# Patient Record
Sex: Male | Born: 1935 | Race: White | Hispanic: No | Smoking: Former smoker
Health system: Southern US, Community
[De-identification: ages and names within clinical notes are randomized; demographics above are authoritative.]

## PROBLEM LIST (undated history)

## (undated) DIAGNOSIS — F0390 Unspecified dementia without behavioral disturbance: Secondary | ICD-10-CM

## (undated) DIAGNOSIS — I499 Cardiac arrhythmia, unspecified: Secondary | ICD-10-CM

## (undated) DIAGNOSIS — R296 Repeated falls: Secondary | ICD-10-CM

## (undated) DIAGNOSIS — K219 Gastro-esophageal reflux disease without esophagitis: Secondary | ICD-10-CM

## (undated) DIAGNOSIS — I4891 Unspecified atrial fibrillation: Secondary | ICD-10-CM

## (undated) HISTORY — PX: CATARACT EXTRACTION: SUR2

## (undated) HISTORY — PX: OTHER SURGICAL HISTORY: SHX169

## (undated) HISTORY — PX: HERNIA REPAIR: SHX51

---

## 2011-03-04 ENCOUNTER — Emergency Department (HOSPITAL_BASED_OUTPATIENT_CLINIC_OR_DEPARTMENT_OTHER)
Admission: EM | Admit: 2011-03-04 | Discharge: 2011-03-04 | Disposition: A | Discharge status: Home / Self Care | Payer: Medicare Other | Attending: Emergency Medicine | Admitting: Emergency Medicine

## 2011-03-04 ENCOUNTER — Encounter (HOSPITAL_BASED_OUTPATIENT_CLINIC_OR_DEPARTMENT_OTHER): Payer: Self-pay

## 2011-03-04 ENCOUNTER — Emergency Department (INDEPENDENT_AMBULATORY_CARE_PROVIDER_SITE_OTHER): Payer: Medicare Other

## 2011-03-04 DIAGNOSIS — M792 Neuralgia and neuritis, unspecified: Secondary | ICD-10-CM

## 2011-03-04 DIAGNOSIS — IMO0002 Reserved for concepts with insufficient information to code with codable children: Secondary | ICD-10-CM | POA: Insufficient documentation

## 2011-03-04 DIAGNOSIS — R51 Headache: Secondary | ICD-10-CM | POA: Insufficient documentation

## 2011-03-04 DIAGNOSIS — Z79899 Other long term (current) drug therapy: Secondary | ICD-10-CM | POA: Insufficient documentation

## 2011-03-04 DIAGNOSIS — G319 Degenerative disease of nervous system, unspecified: Secondary | ICD-10-CM

## 2011-03-04 DIAGNOSIS — H9209 Otalgia, unspecified ear: Secondary | ICD-10-CM

## 2011-03-04 DIAGNOSIS — K219 Gastro-esophageal reflux disease without esophagitis: Secondary | ICD-10-CM | POA: Insufficient documentation

## 2011-03-04 HISTORY — DX: Cardiac arrhythmia, unspecified: I49.9

## 2011-03-04 HISTORY — DX: Gastro-esophageal reflux disease without esophagitis: K21.9

## 2011-03-04 LAB — CBC
HCT: 36.1 % — ABNORMAL LOW (ref 39.0–52.0)
Platelets: 178 10*3/uL (ref 150–400)
RBC: 3.94 MIL/uL — ABNORMAL LOW (ref 4.22–5.81)
RDW: 13.9 % (ref 11.5–15.5)
WBC: 5.3 10*3/uL (ref 4.0–10.5)

## 2011-03-04 LAB — DIFFERENTIAL
Basophils Absolute: 0 10*3/uL (ref 0.0–0.1)
Lymphocytes Relative: 13 % (ref 12–46)
Neutro Abs: 4.2 10*3/uL (ref 1.7–7.7)

## 2011-03-04 LAB — BASIC METABOLIC PANEL
CO2: 26 mEq/L (ref 19–32)
Chloride: 102 mEq/L (ref 96–112)
Sodium: 138 mEq/L (ref 135–145)

## 2011-03-04 MED ORDER — PREGABALIN 25 MG PO CAPS: 25.0000 mg | ORAL_CAPSULE | Freq: Three times a day (TID) | ORAL | Status: AC

## 2011-03-04 NOTE — ED Provider Notes (Signed)
Medical screening examination/treatment/procedure(s) were conducted as a shared visit with non-physician practitioner(s) and myself.  I personally evaluated the patient during the encounter.  Please see complaeted note for this encounter.  Raeford Razor, MD 03/04/11 1348

## 2011-03-04 NOTE — ED Provider Notes (Signed)
History     CSN: [id number]  Arrival date & time 03/04/11  1154   First MD Initiated Contact with Patient 03/04/11 1232     12:33 PM HPI Patient reports this morning at at 8:00 AM developed a sharp pain in his right for head. Since then pain has resolved but migrated to his right postauricular region at 10 AM. States pain is intermittent and fleeting. Reports pain only lasts a few seconds and is severe. Currently pain is 0/10. Denies history of headaches, strokes, or recent falls. Patient is a 78 y.o. male presenting with headaches. The history is provided by the patient.  Headache  This is a new problem. The current episode started 3 to 5 hours ago. The problem occurs every few minutes. The headache is associated with nothing. Pain location: Right frontal and right postauricular. The quality of the pain is described as sharp. The pain is severe. The pain does not radiate. Pertinent negatives include no fever, no malaise/fatigue, no chest pressure, no near-syncope, no palpitations, no syncope, no shortness of breath, no nausea and no vomiting. Associated symptoms comments: Denies neck pain, decreased hearing, numbness, tingling, weakness, change in vision, rhinorrhea, sore throat, fever, or rash.. He has tried nothing for the symptoms.    Past Medical History  Diagnosis Date  . GERD (gastroesophageal reflux disease)   . Irregular heart beat     Past Surgical History  Procedure Date  . Pacemaker   . Hernia repair   . Cataract extraction     No family history on file.  History  Substance Use Topics  . Smoking status: Former Games developer  . Smokeless tobacco: Not on file  . Alcohol Use: No      Review of Systems  Constitutional: Negative for fever, chills and malaise/fatigue.  HENT: Negative for hearing loss, ear pain, congestion, sore throat, rhinorrhea, trouble swallowing, neck pain, neck stiffness, sinus pressure, tinnitus and ear discharge.   Eyes: Negative for pain and visual  disturbance.  Respiratory: Negative for cough and shortness of breath.   Cardiovascular: Negative for chest pain, palpitations, syncope and near-syncope.  Gastrointestinal: Negative for nausea and vomiting.  Skin: Negative for rash.  Neurological: Positive for headaches. Negative for dizziness, speech difficulty, weakness, light-headedness and numbness.  All other systems reviewed and are negative.    Allergies  Review of patient's allergies indicates no known allergies.  Home Medications   Current Outpatient Rx  Name Route Sig Dispense Refill  . ASPIRIN 325 MG PO TABS Oral Take 325 mg by mouth daily.    . ATENOLOL 25 MG PO TABS Oral Take 12.5 mg by mouth daily.    . ENALAPRIL MALEATE 5 MG PO TABS Oral Take 5 mg by mouth daily.    . FENOFIBRATE 54 MG PO TABS Oral Take 54 mg by mouth daily.    Marland Kitchen FINASTERIDE 5 MG PO TABS Oral Take 5 mg by mouth daily.    Marland Kitchen GLUCOSAMINE-CHONDROITIN 500-400 MG PO TABS Oral Take 1 tablet by mouth 3 (three) times daily.    Marland Kitchen OMEPRAZOLE 20 MG PO CPDR Oral Take 20 mg by mouth daily.    Marland Kitchen POTASSIUM CHLORIDE PO Oral Take 10 mEq by mouth.      BP 143/78  Pulse 70  Temp(Src) 97.4 F (36.3 C) (Oral)  Resp 20  Ht 6' (1.829 m)  Wt 175 lb (79.379 kg)  BMI 23.73 kg/m2  SpO2 97%  Physical Exam  Constitutional: He is oriented to person, place, and time.  He appears well-developed and well-nourished. No distress.  HENT:  Head: Normocephalic and atraumatic. Head is without abrasion and without contusion.  Right Ear: Tympanic membrane, external ear and ear canal normal.  Left Ear: Tympanic membrane, external ear and ear canal normal.  Nose: Nose normal.  Mouth/Throat: Uvula is midline, oropharynx is clear and moist and mucous membranes are normal.  Eyes: Conjunctivae, EOM and lids are normal. Pupils are equal, round, and reactive to light.  Neck: Normal range of motion. Neck supple. No spinous process tenderness and no muscular tenderness present.    Cardiovascular: Normal rate, regular rhythm and normal heart sounds.  Exam reveals no gallop and no friction rub.   No murmur heard. Pulmonary/Chest: Effort normal and breath sounds normal. He has no wheezes. He has no rales. He exhibits no tenderness.  Abdominal: Soft. Bowel sounds are normal.  Lymphadenopathy:    He has no cervical adenopathy.  Neurological: He is alert and oriented to person, place, and time. He has normal strength. No cranial nerve deficit or sensory deficit. Coordination normal. GCS eye subscore is 4. GCS verbal subscore is 5. GCS motor subscore is 6.  Skin: Skin is warm and dry. No rash noted. No erythema. No pallor.  Psychiatric: He has a normal mood and affect. His behavior is normal.    ED Course  Procedures  Results for orders placed during the hospital encounter of 03/04/11  CBC      Component Value Range   WBC 5.3  4.0 - 10.5 (K/uL)   RBC 3.94 (*) 4.22 - 5.81 (MIL/uL)   Hemoglobin 12.2 (*) 13.0 - 17.0 (g/dL)   HCT 40.9 (*) 81.1 - 52.0 (%)   MCV 91.6  78.0 - 100.0 (fL)   MCH 31.0  26.0 - 34.0 (pg)   MCHC 33.8  30.0 - 36.0 (g/dL)   RDW 91.4  78.2 - 95.6 (%)   Platelets 178  150 - 400 (K/uL)  DIFFERENTIAL      Component Value Range   Neutrophils Relative 78 (*) 43 - 77 (%)   Neutro Abs 4.2  1.7 - 7.7 (K/uL)   Lymphocytes Relative 13  12 - 46 (%)   Lymphs Abs 0.7  0.7 - 4.0 (K/uL)   Monocytes Relative 7  3 - 12 (%)   Monocytes Absolute 0.4  0.1 - 1.0 (K/uL)   Eosinophils Relative 1  0 - 5 (%)   Eosinophils Absolute 0.1  0.0 - 0.7 (K/uL)   Basophils Relative 0  0 - 1 (%)   Basophils Absolute 0.0  0.0 - 0.1 (K/uL)  BASIC METABOLIC PANEL      Component Value Range   Sodium 138  135 - 145 (mEq/L)   Potassium 4.8  3.5 - 5.1 (mEq/L)   Chloride 102  96 - 112 (mEq/L)   CO2 26  19 - 32 (mEq/L)   Glucose, Bld 105 (*) 70 - 99 (mg/dL)   BUN 28 (*) 6 - 23 (mg/dL)   Creatinine, Ser 2.13  0.50 - 1.35 (mg/dL)   Calcium 9.1  8.4 - 08.6 (mg/dL)   GFR calc non  Af Amer 63 (*) >90 (mL/min)   GFR calc Af Amer 73 (*) >90 (mL/min)   Ct Head Wo Contrast  03/04/2011  *RADIOLOGY REPORT*  Clinical Data: Intermittent sharp pain behind the right ear since yesterday.  CT HEAD WITHOUT CONTRAST  Technique:  Contiguous axial images were obtained from the base of the skull through the vertex without contrast.  Comparison:  None.  Findings: There is no evidence for acute infarction, intracranial hemorrhage, mass lesion, hydrocephalus, or extra-axial fluid. There is moderate age related atrophy with chronic microvascular ischemic change.  Calvarium is intact.  Bilateral cataract extraction has been performed.  There is advanced vascular calcification involving the vertebral arteries, carotid artery, and basilar artery.  There are no CT signs of proximal vascular thrombosis.  IMPRESSION: Atrophy and small vessel disease.  No acute intracranial findings.  Original Report Authenticated By: Elsie Stain, M.D.     MDM   12:39 PM Patient points to pain in a specific spot in the right postauricular area. Will order CT scan basic labs to evaluate for stroke.  1:36 PM Discussed results with patient and family. Likely patient is having neuropathic pain. Will discharge patient with one week of Lyrica. Advised patient to followup with primary care physician if symptoms persist or to obtain a refill. Advised patient to return to ED for worsening symptoms such as numbness, tingling, weakness, change in vision, difficulty with speech. Patient and family agree on plan and for discharge.  Thomasene Lot, PA-C 03/04/11 1340

## 2011-03-04 NOTE — ED Provider Notes (Signed)
Medical screening examination/treatment/procedure(s) were conducted as a shared visit with non-physician practitioner(s) and myself.  I personally evaluated the patient during the encounter.  78 year old male with headache. Patient describes painas very brief lasting 1-3 seconds and sharp in nature. Pain is in the right occipital region into his right ear. No neurological complaints and has a nonfocal neurological examination. Workup today including a CT of his head was unremarkable. Symptoms are consistent with neuralgia. patient requesting something for his pain. Discussed with patient and his son that this pain can often be hard to control but will try a low-dose Lyrica. Discussed that this may end up being a chronic issue and  further management or treatment is more appropriate for his primary care doctor.  Raeford Razor, MD 03/04/11 [phone]

## 2011-03-04 NOTE — ED Notes (Signed)
C/o HA right forehead started 8am-pain behind right ear started 10am-denies pain to forehead area at present-pain behind right ear is intermittent and severe

## 2011-11-14 ENCOUNTER — Emergency Department (HOSPITAL_BASED_OUTPATIENT_CLINIC_OR_DEPARTMENT_OTHER): Payer: Medicare Other

## 2011-11-14 ENCOUNTER — Encounter (HOSPITAL_BASED_OUTPATIENT_CLINIC_OR_DEPARTMENT_OTHER): Payer: Self-pay | Admitting: *Deleted

## 2011-11-14 ENCOUNTER — Emergency Department (HOSPITAL_BASED_OUTPATIENT_CLINIC_OR_DEPARTMENT_OTHER)
Admission: EM | Admit: 2011-11-14 | Discharge: 2011-11-14 | Disposition: A | Discharge status: Home / Self Care | Payer: Medicare Other | Attending: Emergency Medicine | Admitting: Emergency Medicine

## 2011-11-14 DIAGNOSIS — W1789XA Other fall from one level to another, initial encounter: Secondary | ICD-10-CM | POA: Insufficient documentation

## 2011-11-14 DIAGNOSIS — M25519 Pain in unspecified shoulder: Secondary | ICD-10-CM | POA: Insufficient documentation

## 2011-11-14 DIAGNOSIS — Z950 Presence of cardiac pacemaker: Secondary | ICD-10-CM | POA: Insufficient documentation

## 2011-11-14 DIAGNOSIS — Z7982 Long term (current) use of aspirin: Secondary | ICD-10-CM | POA: Insufficient documentation

## 2011-11-14 DIAGNOSIS — Z87891 Personal history of nicotine dependence: Secondary | ICD-10-CM | POA: Insufficient documentation

## 2011-11-14 DIAGNOSIS — K219 Gastro-esophageal reflux disease without esophagitis: Secondary | ICD-10-CM | POA: Insufficient documentation

## 2011-11-14 DIAGNOSIS — M25539 Pain in unspecified wrist: Secondary | ICD-10-CM | POA: Insufficient documentation

## 2011-11-14 DIAGNOSIS — W19XXXA Unspecified fall, initial encounter: Secondary | ICD-10-CM

## 2011-11-14 NOTE — ED Notes (Signed)
Pt c/o fall this am from standing c/o right shoulder and wrist pain

## 2011-11-14 NOTE — ED Provider Notes (Signed)
History   This chart was scribed for Leon Chick, MD by Leon Evans. The patient was seen in room MH05/MH05 and the patient's care was started at 3:27PM    CSN: [id number]  Arrival date & time 11/14/11  1440   First MD Initiated Contact with Patient 11/14/11 1527      Chief Complaint  Patient presents with  . Fall    (Consider location/radiation/quality/duration/timing/severity/associated sxs/prior treatment) Patient is a 78 y.o. male presenting with fall. The history is provided by the patient. No language interpreter was used.  Fall The accident occurred 3 to 5 hours ago. The fall occurred while standing. He fell from a height of 3 to 5 ft. He landed on carpet. There was no blood loss. The point of impact was the head, right wrist and right shoulder. The pain is present in the right wrist and right shoulder. The pain is moderate. He was ambulatory at the scene. There was no entrapment after the fall. There was no drug use involved in the accident. There was no alcohol use involved in the accident. Pertinent negatives include no visual change, no numbness, no nausea, no vomiting, no headaches, no loss of consciousness and no tingling. The symptoms are aggravated by pressure on the injury. He has tried nothing for the symptoms. The treatment provided no relief.    Leon Evans is a 78 y.o. male who lives at [facility], who presents to the Emergency Department complaining of sudden, progressively worsening, wrist pain located at the right wrist onset today, with associated symptoms of right shoulder pain. The pt reports he fell backwards this morning, tripping over his foot, and impacting upon his right shoulder, right wrist, and head. In addition, the pt informs that when he fell he tried to brace himself with his right wrist just before hitting the ground. Modifying factors include taking aleve which provides moderate relief of the right wrist pain and application of pressure on the right  wrist which intensifies the right wrist pain. The pt has a hx of GERD, irregular heart beat, pacemaker placement, hernia repair, and cataract extraction.   No LOC, no vomiting, no seizure activity.    The pt denies taking any blood thinners at present, LOC, and dizziness.    The pt does not smoke or drink alcohol.    Past Medical History  Diagnosis Date  . GERD (gastroesophageal reflux disease)   . Irregular heart beat     Past Surgical History  Procedure Date  . Pacemaker   . Hernia repair   . Cataract extraction     History reviewed. No pertinent family history.  History  Substance Use Topics  . Smoking status: Former Games developer  . Smokeless tobacco: Not on file  . Alcohol Use: No      Review of Systems  Gastrointestinal: Negative for nausea and vomiting.  Neurological: Negative for tingling, loss of consciousness, numbness and headaches.  All other systems reviewed and are negative.    Allergies  Review of patient's allergies indicates no known allergies.  Home Medications   Current Outpatient Rx  Name Route Sig Dispense Refill  . FUROSEMIDE 20 MG PO TABS Oral Take 20 mg by mouth 2 (two) times daily.    . ASPIRIN 325 MG PO TABS Oral Take 325 mg by mouth daily.    . ATENOLOL 25 MG PO TABS Oral Take 12.5 mg by mouth daily.    . ENALAPRIL MALEATE 5 MG PO TABS Oral Take 5 mg by  mouth daily.    . FENOFIBRATE 54 MG PO TABS Oral Take 54 mg by mouth daily.    Marland Kitchen FINASTERIDE 5 MG PO TABS Oral Take 5 mg by mouth daily.    Marland Kitchen GLUCOSAMINE-CHONDROITIN 500-400 MG PO TABS Oral Take 1 tablet by mouth 3 (three) times daily.    Marland Kitchen OMEPRAZOLE 20 MG PO CPDR Oral Take 20 mg by mouth daily.    Marland Kitchen POTASSIUM CHLORIDE PO Oral Take 10 mEq by mouth.    Marland Kitchen PREGABALIN 25 MG PO CAPS Oral Take 1 capsule (25 mg total) by mouth 3 (three) times daily. 21 capsule 0    BP 169/80  Pulse 72  Temp 97.8 F (36.6 C) (Oral)  Resp 16  Ht 6' (1.829 m)  Wt 166 lb (75.297 kg)  BMI 22.51 kg/m2  SpO2  100%  Physical Exam  Nursing note and vitals reviewed. Constitutional: He is oriented to person, place, and time. He appears well-developed and well-nourished.  HENT:  Head: Atraumatic.  Right Ear: External ear normal.  Left Ear: External ear normal.  Nose: Nose normal.  Eyes: Conjunctivae normal and EOM are normal.  Neck: Normal range of motion. Neck supple.  Cardiovascular: Normal rate, regular rhythm and normal heart sounds.   Pulmonary/Chest: Effort normal and breath sounds normal.  Abdominal: Soft.  Musculoskeletal: Normal range of motion.       Cervical back: He exhibits no tenderness.       Thoracic back: He exhibits no tenderness.       Lumbar back: He exhibits no tenderness.       3 cm bruising over the right scapula, no midline tenderness. Right shoulder: full ROM, no bony point tenderness. Right wrist: no tenderness to palpitation, full ROM, no deformity detected. No anatomic snuff box tenderness at right wrist. All extremities NVI.  Neurological: He is alert and oriented to person, place, and time.  Skin: Skin is warm and dry.  Psychiatric: He has a normal mood and affect. His behavior is normal.    ED Course  Procedures (including critical care time)  DIAGNOSTIC STUDIES: Oxygen Saturation is 100% on room air, normal by my interpretation.    COORDINATION OF CARE:    3:49Pm- X-rays of right wrist and right shoulder discussed with patient. Pt agrees with treatment.   Labs Reviewed - No data to display Dg Shoulder Right  11/14/2011  *RADIOLOGY REPORT*  Clinical Data: Fall, pain.  RIGHT SHOULDER - 2+ VIEW  Comparison:  None.  Findings:  There is no evidence of fracture or dislocation.  There is no evidence of arthropathy or other focal bone abnormality. Soft tissues are unremarkable. Mild degenerative change AC joint. Mild demineralization.  IMPRESSION: No acute abnormality.   Original Report Authenticated By: Elsie Stain, M.D.    Dg Wrist Complete  Right  11/14/2011  *RADIOLOGY REPORT*  Clinical Data: Fall, pain  RIGHT WRIST - COMPLETE 3+ VIEW  Comparison: None.  Findings: Mild degenerative change at the radiocarpal joint.  No fracture or dislocation.  No significant soft tissue swelling.  IMPRESSION: As above.   Original Report Authenticated By: Elsie Stain, M.D.       1. Fall   2. Shoulder pain   3. Wrist pain       MDM  Pt presents with pain in right shoulder and wrist after mechanical fall today.  No abnormalities on xray.  No trauma to head noted, not on blood thinners.  Pt declines pain medications.  Discharged with  strict return precautions.  Pt agreeable with plan.    I personally performed the services described in this documentation, which was scribed in my presence. The recorded information has been reviewed and considered.    Leon Chick, MD 11/14/11 [phone]

## 2014-10-10 ENCOUNTER — Emergency Department (HOSPITAL_BASED_OUTPATIENT_CLINIC_OR_DEPARTMENT_OTHER)
Admission: EM | Admit: 2014-10-10 | Discharge: 2014-10-10 | Disposition: A | Discharge status: Home / Self Care | Payer: Medicare Other | Attending: Emergency Medicine | Admitting: Emergency Medicine

## 2014-10-10 ENCOUNTER — Emergency Department (HOSPITAL_BASED_OUTPATIENT_CLINIC_OR_DEPARTMENT_OTHER): Payer: Medicare Other

## 2014-10-10 ENCOUNTER — Encounter (HOSPITAL_BASED_OUTPATIENT_CLINIC_OR_DEPARTMENT_OTHER): Payer: Self-pay

## 2014-10-10 DIAGNOSIS — Y998 Other external cause status: Secondary | ICD-10-CM | POA: Diagnosis not present

## 2014-10-10 DIAGNOSIS — K219 Gastro-esophageal reflux disease without esophagitis: Secondary | ICD-10-CM | POA: Insufficient documentation

## 2014-10-10 DIAGNOSIS — Y92009 Unspecified place in unspecified non-institutional (private) residence as the place of occurrence of the external cause: Secondary | ICD-10-CM | POA: Diagnosis not present

## 2014-10-10 DIAGNOSIS — Z8679 Personal history of other diseases of the circulatory system: Secondary | ICD-10-CM | POA: Insufficient documentation

## 2014-10-10 DIAGNOSIS — Z7982 Long term (current) use of aspirin: Secondary | ICD-10-CM | POA: Insufficient documentation

## 2014-10-10 DIAGNOSIS — W19XXXA Unspecified fall, initial encounter: Secondary | ICD-10-CM

## 2014-10-10 DIAGNOSIS — Z79899 Other long term (current) drug therapy: Secondary | ICD-10-CM | POA: Diagnosis not present

## 2014-10-10 DIAGNOSIS — S8992XA Unspecified injury of left lower leg, initial encounter: Secondary | ICD-10-CM | POA: Diagnosis not present

## 2014-10-10 DIAGNOSIS — W010XXA Fall on same level from slipping, tripping and stumbling without subsequent striking against object, initial encounter: Secondary | ICD-10-CM | POA: Insufficient documentation

## 2014-10-10 DIAGNOSIS — S51802A Unspecified open wound of left forearm, initial encounter: Secondary | ICD-10-CM | POA: Insufficient documentation

## 2014-10-10 DIAGNOSIS — Y9389 Activity, other specified: Secondary | ICD-10-CM | POA: Insufficient documentation

## 2014-10-10 NOTE — ED Provider Notes (Signed)
CSN: [id number]     Arrival date & time 10/10/14  1713 History   First MD Initiated Contact with Patient 10/10/14 1723     Chief Complaint  Patient presents with  . Fall     (Consider location/radiation/quality/duration/timing/severity/associated sxs/prior Treatment) HPI Comments: Tripped on the carpet and fell hitting his knee earlier today. Sustained a skin tear on his L elbow also.  Patient is a 81 y.o. male presenting with fall. The history is provided by the patient.  Fall This is a new problem. The current episode started 3 to 5 hours ago. The problem occurs constantly. The problem has not changed since onset.Pertinent negatives include no shortness of breath. Nothing aggravates the symptoms. Nothing relieves the symptoms.    Past Medical History  Diagnosis Date  . GERD (gastroesophageal reflux disease)   . Irregular heart beat    Past Surgical History  Procedure Laterality Date  . Pacemaker    . Hernia repair    . Cataract extraction     No family history on file. Social History  Substance Use Topics  . Smoking status: Former Games developer  . Smokeless tobacco: None  . Alcohol Use: No    Review of Systems  Constitutional: Negative for fever.  Respiratory: Negative for cough and shortness of breath.   All other systems reviewed and are negative.     Allergies  Review of patient's allergies indicates no known allergies.  Home Medications   Prior to Admission medications   Medication Sig Start Date End Date Taking? Authorizing Provider  aspirin 325 MG tablet Take 325 mg by mouth daily.    Historical Provider, MD  atenolol (TENORMIN) 25 MG tablet Take 12.5 mg by mouth daily.    Historical Provider, MD  enalapril (VASOTEC) 5 MG tablet Take 5 mg by mouth daily.    Historical Provider, MD  fenofibrate 54 MG tablet Take 54 mg by mouth daily.    Historical Provider, MD  finasteride (PROSCAR) 5 MG tablet Take 5 mg by mouth daily.    Historical Provider, MD  furosemide  (LASIX) 20 MG tablet Take 20 mg by mouth 2 (two) times daily.    Historical Provider, MD  glucosamine-chondroitin 500-400 MG tablet Take 1 tablet by mouth 3 (three) times daily.    Historical Provider, MD  omeprazole (PRILOSEC) 20 MG capsule Take 20 mg by mouth daily.    Historical Provider, MD  POTASSIUM CHLORIDE PO Take 10 mEq by mouth.    Historical Provider, MD  pregabalin (LYRICA) 25 MG capsule Take 1 capsule (25 mg total) by mouth 3 (three) times daily. 03/04/11 03/03/12  Brigitte Cyndie Chime, PA-C   BP 156/73 mmHg  Pulse 86  Temp(Src) 98 F (36.7 C) (Oral)  Resp 18  Ht 6' (1.829 m)  Wt 170 lb (77.111 kg)  BMI 23.05 kg/m2  SpO2 98% Physical Exam  Constitutional: He is oriented to person, place, and time. He appears well-developed and well-nourished. No distress.  HENT:  Head: Normocephalic and atraumatic.  Mouth/Throat: Oropharynx is clear and moist. No oropharyngeal exudate.  Eyes: EOM are normal. Pupils are equal, round, and reactive to light.  Neck: Normal range of motion. Neck supple.  Cardiovascular: Normal rate and regular rhythm.  Exam reveals no friction rub.   No murmur heard. Pulmonary/Chest: Effort normal and breath sounds normal. No respiratory distress. He has no wheezes. He has no rales.  Abdominal: Soft. He exhibits no distension. There is no tenderness. There is no rebound.  Musculoskeletal: Normal range  of motion. He exhibits no edema.       Left knee: He exhibits swelling (superior tibia). He exhibits normal range of motion and no effusion. Tenderness (lateral joint, superior tibia) found.       Arms: Neurological: He is alert and oriented to person, place, and time.  Skin: No rash noted. He is not diaphoretic.  Nursing note and vitals reviewed.   ED Course  Procedures (including critical care time) Labs Review Labs Reviewed - No data to display  Imaging Review Dg Knee Complete 4 Views Left  10/10/2014   CLINICAL DATA:  81 year old male with history of trauma  from a fall complaining of left knee pain and swelling.  EXAM: LEFT KNEE - COMPLETE 4+ VIEW  COMPARISON:  No priors.  FINDINGS: Multiple views of the left knee demonstrate no acute displaced fracture, subluxation, dislocation, or soft tissue abnormality. Extensive atherosclerotic calcifications are noted.  IMPRESSION: 1.  No acute radiographic abnormality of the left knee. 2. Extensive atherosclerosis.   Electronically Signed   By: Trudie Reed M.D.   On: 10/10/2014 18:48   I have personally reviewed and evaluated these images and lab results as part of my medical decision-making.   EKG Interpretation None      MDM   Final diagnoses:  Fall    86M here after a mechanical fall at his nursing home. He tripped over carpet. No preceding symptoms. No head injury or loss of consciousness. He had a skin tear on his left elbow that was repaired with Steri-Strips at his nursing home. L knee swelling.  Will xray his L knee. Xray ok, stable for discharge.   Elwin Mocha, MD 10/10/14 [phone]

## 2014-10-10 NOTE — Discharge Instructions (Signed)

## 2014-10-10 NOTE — ED Notes (Signed)
Tripped over carpet at home-fall-pain to left LE and left UE-pt A/O-ambulates with own walker-lives independent at [facility] with pt

## 2014-10-10 NOTE — ED Notes (Signed)
Steri strips noted to left elbow placed  PTA by [facility] employee

## 2016-06-25 ENCOUNTER — Emergency Department (HOSPITAL_COMMUNITY): Payer: Medicare Other

## 2016-06-25 ENCOUNTER — Emergency Department (HOSPITAL_COMMUNITY)
Admission: EM | Admit: 2016-06-25 | Discharge: 2016-06-25 | Disposition: A | Discharge status: Home / Self Care | Payer: Medicare Other | Attending: Emergency Medicine | Admitting: Emergency Medicine

## 2016-06-25 ENCOUNTER — Encounter (HOSPITAL_COMMUNITY): Payer: Self-pay | Admitting: Emergency Medicine

## 2016-06-25 DIAGNOSIS — S7001XA Contusion of right hip, initial encounter: Secondary | ICD-10-CM | POA: Insufficient documentation

## 2016-06-25 DIAGNOSIS — S7011XA Contusion of right thigh, initial encounter: Secondary | ICD-10-CM | POA: Insufficient documentation

## 2016-06-25 DIAGNOSIS — Z7982 Long term (current) use of aspirin: Secondary | ICD-10-CM | POA: Insufficient documentation

## 2016-06-25 DIAGNOSIS — S0081XA Abrasion of other part of head, initial encounter: Secondary | ICD-10-CM | POA: Diagnosis not present

## 2016-06-25 DIAGNOSIS — Y999 Unspecified external cause status: Secondary | ICD-10-CM | POA: Diagnosis not present

## 2016-06-25 DIAGNOSIS — W19XXXA Unspecified fall, initial encounter: Secondary | ICD-10-CM | POA: Insufficient documentation

## 2016-06-25 DIAGNOSIS — Z87891 Personal history of nicotine dependence: Secondary | ICD-10-CM | POA: Insufficient documentation

## 2016-06-25 DIAGNOSIS — R930 Abnormal findings on diagnostic imaging of skull and head, not elsewhere classified: Secondary | ICD-10-CM | POA: Diagnosis not present

## 2016-06-25 DIAGNOSIS — Y939 Activity, unspecified: Secondary | ICD-10-CM | POA: Diagnosis not present

## 2016-06-25 DIAGNOSIS — R0902 Hypoxemia: Secondary | ICD-10-CM | POA: Insufficient documentation

## 2016-06-25 DIAGNOSIS — S79911A Unspecified injury of right hip, initial encounter: Secondary | ICD-10-CM | POA: Diagnosis present

## 2016-06-25 DIAGNOSIS — Y929 Unspecified place or not applicable: Secondary | ICD-10-CM | POA: Diagnosis not present

## 2016-06-25 HISTORY — DX: Unspecified dementia, unspecified severity, without behavioral disturbance, psychotic disturbance, mood disturbance, and anxiety: F03.90

## 2016-06-25 HISTORY — DX: Unspecified atrial fibrillation: I48.91

## 2016-06-25 HISTORY — DX: Repeated falls: R29.6

## 2016-06-25 LAB — URINALYSIS, ROUTINE W REFLEX MICROSCOPIC
BILIRUBIN URINE: NEGATIVE
Glucose, UA: NEGATIVE mg/dL
Hgb urine dipstick: NEGATIVE
KETONES UR: NEGATIVE mg/dL
LEUKOCYTES UA: NEGATIVE
Nitrite: NEGATIVE
PH: 5 (ref 5.0–8.0)
Protein, ur: 30 mg/dL — AB
Specific Gravity, Urine: 1.025 (ref 1.005–1.030)

## 2016-06-25 LAB — I-STAT ARTERIAL BLOOD GAS, ED
Acid-base deficit: 2 mmol/L (ref 0.0–2.0)
BICARBONATE: 20.6 mmol/L (ref 20.0–28.0)
O2 SAT: 86 %
PCO2 ART: 27.3 mmHg — AB (ref 32.0–48.0)
PH ART: 7.483 — AB (ref 7.350–7.450)
PO2 ART: 45 mmHg — AB (ref 83.0–108.0)
Patient temperature: 97.4
TCO2: 21 mmol/L (ref 0–100)

## 2016-06-25 LAB — I-STAT CHEM 8, ED
BUN: 19 mg/dL (ref 6–20)
CALCIUM ION: 1.06 mmol/L — AB (ref 1.15–1.40)
Chloride: 108 mmol/L (ref 101–111)
Creatinine, Ser: 0.9 mg/dL (ref 0.61–1.24)
GLUCOSE: 100 mg/dL — AB (ref 65–99)
HCT: 32 % — ABNORMAL LOW (ref 39.0–52.0)
HEMOGLOBIN: 10.9 g/dL — AB (ref 13.0–17.0)
Potassium: 4.1 mmol/L (ref 3.5–5.1)
Sodium: 140 mmol/L (ref 135–145)
TCO2: 24 mmol/L (ref 0–100)

## 2016-06-25 LAB — I-STAT TROPONIN, ED: Troponin i, poc: 0.03 ng/mL (ref 0.00–0.08)

## 2016-06-25 LAB — CK: Total CK: 100 U/L (ref 49–397)

## 2016-06-25 MED ORDER — ACETAMINOPHEN 325 MG PO TABS
650.0000 mg | ORAL_TABLET | Freq: Once | ORAL | Status: AC
  Administered 2016-06-25: 650 mg via ORAL
  Filled 2016-06-25: qty 2

## 2016-06-25 NOTE — ED Notes (Signed)
Report given to Misty StanleyLisa, [facility]ity fundraiserN at [facility]. Made aware pt needs to be on 4 L [place]. Is documented in d/c instructions. Secretary will be calling [facility] to transport back.

## 2016-06-25 NOTE — ED Notes (Signed)
Pt's son Drue DunWalter Goeken calling to check in on patient. His contact # [phone].

## 2016-06-25 NOTE — ED Notes (Signed)
Pt given water. Clothing put in bag. Abrasion to top of head cleaned and bacitracin applied. His son, Drue DunWalter Gedeon called and made aware pt is being taken back to facility.

## 2016-06-25 NOTE — ED Triage Notes (Signed)
[facility] with [facility] , found on the floor by staff this morning , presents with skin abrasion/skin tear  at upper forehead . Alert but confused at arrival , respirations unlabored .

## 2016-06-25 NOTE — ED Notes (Signed)
Patient transported to CT 

## 2016-06-25 NOTE — Discharge Instructions (Signed)
Leon Evans requires oxygen 4 L nasal cannula. Right hip x-ray and chest x-ray and CT scans of head and cervical spine showed no acute abnormality. No evidence of infection. He can have Tylenol as needed for aches. Wash abrasion on his forehead daily with soap and water. Signs of infection include redness, swelling, drainage from the wound or fever. Call his physician if concern for any reason or he can return to the emergency department if condition worsens for any reason

## 2016-06-25 NOTE — ED Provider Notes (Signed)
[facility]-EMER[facility]ENCY DEPT Provider Note   CSN: [id number] Arrival date & time: 06/25/16  5284     History   Chief Complaint Chief Complaint  Patient presents with  . Fall  Level V caveat dementia  HPI Leon Evans is a 83 y.o. male.History is obtained from Westley Hummer, LPN at [facility] assisted living facility. Patient was found on the floor at 5:20 AM today at the assisted-living facility unknown what time he fell. Patient denies complaint. He was found to have an abrasion on his forehead since the fall. No treatment prior to coming here  HPI  Past Medical History:  Diagnosis Date  . A-fib (HCC)   . Dementia   . GERD (gastroesophageal reflux disease)   . Irregular heart beat   . Multiple falls     There are no active problems to display for this patient.   Past Surgical History:  Procedure Laterality Date  . CATARACT EXTRACTION    . HERNIA REPAIR    . pacemaker         Home Medications    Prior to Admission medications   Medication Sig Start Date End Date Taking? Authorizing Provider  aspirin 325 MG tablet Take 325 mg by mouth daily.    [provider]  atenolol (TENORMIN) 25 MG tablet Take 12.5 mg by mouth daily.    [provider]  enalapril (VASOTEC) 5 MG tablet Take 5 mg by mouth daily.    [provider]  fenofibrate 54 MG tablet Take 54 mg by mouth daily.    [provider]  finasteride (PROSCAR) 5 MG tablet Take 5 mg by mouth daily.    [provider]  furosemide (LASIX) 20 MG tablet Take 20 mg by mouth 2 (two) times daily.    [provider]  glucosamine-chondroitin 500-400 MG tablet Take 1 tablet by mouth 3 (three) times daily.    [provider]  omeprazole (PRILOSEC) 20 MG capsule Take 20 mg by mouth daily.    [provider]  POTASSIUM CHLORIDE PO Take 10 mEq by mouth.    [provider]  pregabalin (LYRICA) 25 MG capsule Take 1 capsule (25 mg total) by mouth 3 (three)  times daily. 03/04/11 03/03/12  Thomasene Lot, PA-C    Family History No family history on file.  Social History Social History  Substance Use Topics  . Smoking status: Former Games developer  . Smokeless tobacco: Never Used  . Alcohol use No    DO NOT RESUSCITATE CODE STATUS lives in assisted living facility Allergies   Patient has no known allergies.   Review of Systems Review of Systems  Unable to perform ROS: Dementia  Skin: Positive for wound.       Abrasion to forehead     Physical Exam Updated Vital Signs BP (!) 152/72   Pulse (!) 59   Temp 98.1 F (36.7 C) (Temporal)   Resp 15   Ht 6\' 3"  (1.905 m)   Wt 220 lb (99.8 kg)   SpO2 95%   BMI 27.50 kg/m   Physical Exam  Constitutional:  Chronically ill and Frail-appearing  HENT:  Circular abrasion to the center forehead no surrounding soft tissue swelling otherwise normal as documented atraumatic  Eyes: EOM are normal.  Neck: Neck supple. No tracheal deviation present. No thyromegaly present.  No tenderness  Cardiovascular: Normal rate and regular rhythm.   No murmur heard. Pulmonary/Chest: Effort normal and breath sounds normal. He exhibits no tenderness.  Abdominal: Soft.  Bowel sounds are normal. He exhibits no distension. There is no tenderness.  Musculoskeletal: Normal range of motion. He exhibits no edema or tenderness.  Pelvis stable nontender. Entire spine nontender. Right lower extremity purplish ecchymosis overlying the right hip and proximal lateral thigh. No tenderness. No pain on internal or external rotation of either thigh in an Unna boot from the distally. Toes with good capillary refill. Left lower extremity with 2+ pretibial edema. Unna boot was removed to reveal open wound at mid shin without drainage, clean-appearing showing no evidence of infection. Bilateral lower extremities with chronic appearing brawny skin changes below the knees  Neurological: He is alert. Coordination normal.  Cranial nerves  II through XII grossly intact. Moves all extremities follow simple commands  Skin: Skin is warm and dry. No rash noted.  Psychiatric: He has a normal mood and affect.  Nursing note and vitals reviewed.    ED Treatments / Results  Labs (all labs ordered are listed, but only abnormal results are displayed) Labs Reviewed  CK  I-STAT CHEM 8, ED    EKG  EKG Interpretation None       Radiology No results found.  Procedures Procedures (including critical care time)  Medications Ordered in ED Medications - No data to display Results for orders placed or performed during the hospital encounter of 06/25/16  CK  Result Value Ref Range   Total CK 100 49 - 397 U/L  Urinalysis, Routine w reflex microscopic  Result Value Ref Range   Color, Urine YELLOW YELLOW   APPearance CLEAR CLEAR   Specific Gravity, Urine 1.025 1.005 - 1.030   pH 5.0 5.0 - 8.0   Glucose, UA NEGATIVE NEGATIVE mg/dL   Hgb urine dipstick NEGATIVE NEGATIVE   Bilirubin Urine NEGATIVE NEGATIVE   Ketones, ur NEGATIVE NEGATIVE mg/dL   Protein, ur 30 (A) NEGATIVE mg/dL   Nitrite NEGATIVE NEGATIVE   Leukocytes, UA NEGATIVE NEGATIVE   RBC / HPF 0-5 0 - 5 RBC/hpf   WBC, UA 0-5 0 - 5 WBC/hpf   Bacteria, UA RARE (A) NONE SEEN   Squamous Epithelial / LPF 0-5 (A) NONE SEEN   Mucous PRESENT   I-stat chem 8, ed  Result Value Ref Range   Sodium 140 135 - 145 mmol/L   Potassium 4.1 3.5 - 5.1 mmol/L   Chloride 108 101 - 111 mmol/L   BUN 19 6 - 20 mg/dL   Creatinine, Ser 1.610.90 0.61 - 1.24 mg/dL   Glucose, Bld 096100 (H) 65 - 99 mg/dL   Calcium, Ion 0.451.06 (L) 1.15 - 1.40 mmol/L   TCO2 24 0 - 100 mmol/L   Hemoglobin 10.9 (L) 13.0 - 17.0 g/dL   HCT 40.932.0 (L) 81.139.0 - 91.452.0 %  I-stat troponin, ED  Result Value Ref Range   Troponin i, poc 0.03 0.00 - 0.08 ng/mL   Comment 3          I-Stat arterial blood gas, ED  Result Value Ref Range   pH, Arterial 7.483 (H) 7.350 - 7.450   pCO2 arterial 27.3 (L) 32.0 - 48.0 mmHg   pO2,  Arterial 45.0 (L) 83.0 - 108.0 mmHg   Bicarbonate 20.6 20.0 - 28.0 mmol/L   TCO2 21 0 - 100 mmol/L   O2 Saturation 86.0 %   Acid-base deficit 2.0 0.0 - 2.0 mmol/L   Patient temperature 97.4 F    Collection site RADIAL, ALLEN'S TEST ACCEPTABLE    Drawn by Operator    Sample type ARTERIAL  Ct Head Wo Contrast  Result Date: 06/25/2016 CLINICAL DATA:  Fall, abrasions forehead EXAM: CT HEAD WITHOUT CONTRAST CT CERVICAL SPINE WITHOUT CONTRAST TECHNIQUE: Multidetector CT imaging of the head and cervical spine was performed following the standard protocol without intravenous contrast. Multiplanar CT image reconstructions of the cervical spine were also generated. COMPARISON:  03/04/2011 FINDINGS: CT HEAD FINDINGS Brain: There is atrophy and chronic small vessel disease changes. No acute intracranial abnormality. Specifically, no hemorrhage, hydrocephalus, mass lesion, acute infarction, or significant intracranial injury. Vascular: No hyperdense vessel or unexpected calcification. Skull: No acute calvarial abnormality. Sinuses/Orbits: Mucosal thickening in the ethmoid air cells and right maxillary sinus. No air-fluid levels. Mastoids are clear. Other: None CT CERVICAL SPINE FINDINGS Alignment: Normal Skull base and vertebrae: No fracture. Soft tissues and spinal canal: No epidural or paraspinal hematoma. Prevertebral soft tissues are normal. Disc levels:  Diffuse degenerative disc and facet disease. Upper chest: Layering bilateral pleural effusions and ground-glass opacities in the lungs, possibly edema. Other: None IMPRESSION: No acute intracranial abnormality. Atrophy, chronic small vessel disease. Degenerative changes in the cervical spine. No acute bony abnormality. Bilateral pleural effusions and ground-glass airspace opacities in the upper lobes, likely edema. Electronically Signed   By: Charlett Nose M.D.   On: 06/25/2016 08:40   Ct Cervical Spine Wo Contrast  Result Date: 06/25/2016 CLINICAL DATA:   Fall, abrasions forehead EXAM: CT HEAD WITHOUT CONTRAST CT CERVICAL SPINE WITHOUT CONTRAST TECHNIQUE: Multidetector CT imaging of the head and cervical spine was performed following the standard protocol without intravenous contrast. Multiplanar CT image reconstructions of the cervical spine were also generated. COMPARISON:  03/04/2011 FINDINGS: CT HEAD FINDINGS Brain: There is atrophy and chronic small vessel disease changes. No acute intracranial abnormality. Specifically, no hemorrhage, hydrocephalus, mass lesion, acute infarction, or significant intracranial injury. Vascular: No hyperdense vessel or unexpected calcification. Skull: No acute calvarial abnormality. Sinuses/Orbits: Mucosal thickening in the ethmoid air cells and right maxillary sinus. No air-fluid levels. Mastoids are clear. Other: None CT CERVICAL SPINE FINDINGS Alignment: Normal Skull base and vertebrae: No fracture. Soft tissues and spinal canal: No epidural or paraspinal hematoma. Prevertebral soft tissues are normal. Disc levels:  Diffuse degenerative disc and facet disease. Upper chest: Layering bilateral pleural effusions and ground-glass opacities in the lungs, possibly edema. Other: None IMPRESSION: No acute intracranial abnormality. Atrophy, chronic small vessel disease. Degenerative changes in the cervical spine. No acute bony abnormality. Bilateral pleural effusions and ground-glass airspace opacities in the upper lobes, likely edema. Electronically Signed   By: Charlett Nose M.D.   On: 06/25/2016 08:40   Dg Chest Port 1 View  Result Date: 06/25/2016 CLINICAL DATA:  Fall, right hip bruising. EXAM: PORTABLE CHEST 1 VIEW COMPARISON:  08/04/2009. FINDINGS: Trachea is midline. Heart size stable. Thoracic aorta is calcified. Left subclavian pacemaker lead tip is in the right ventricle. Added density in the right midlung zone is unchanged. Lungs are hyperinflated but otherwise clear. No pleural fluid. IMPRESSION: No acute findings.  Electronically Signed   By: Leanna Battles M.D.   On: 06/25/2016 09:26   Dg Hip Unilat W Or Wo Pelvis 2-3 Views Right  Result Date: 06/25/2016 CLINICAL DATA:  fall, ecchymosis over right hip EXAM: DG HIP (WITH OR WITHOUT PELVIS) 2-3V RIGHT COMPARISON:  06/13/2016 FINDINGS: No fracture.  No bone lesion. Hip joints are normally aligned with mild bilateral concentric hip joint space narrowing. Bones are demineralized. There is soft tissue edema lateral to the right hip. IMPRESSION: 1. No fracture or dislocation. Electronically  Signed   By: Amie Portland M.D.   On: 06/25/2016 09:24    Initial Impression / Assessment and Plan / ED Course  I have reviewed the triage vital signs and the nursing notes.  Pertinent labs & imaging results that were available during my care of the patient were reviewed by me and considered in my medical decision making (see chart for details).     X-rays viewed by me. Low pretest clinical suspicion for hip fracture. I don't feel further imaging is needed. Patient does have oxygen requirement. He'll be sent back to the assisted-living facility with oxygen 4 L. As he does qualify for home oxygen No signs of infection or sepsis Final Clinical Impressions(s) / ED Diagnoses  Diagnosis #1 fall #2 contusions multiple sites #3 hypoxia  Final diagnoses:  None    New Prescriptions New Prescriptions   No medications on file     Doug Sou, MD 06/25/16 1127

## 2016-06-25 NOTE — Care Management Note (Addendum)
Case Management Note  Patient Details  Name: Leon DunWalter Eimer MRN: [id number] Date of Birth: 10/13/1916  Subjective/Objective:  CM received consult for Home O2 for this 83 y.o. [place] [facility] [place] Seen in the ED after an unwitnessed fall. Dr Ethelda ChickJacubowitz states he will not be admitted but has had O2 sats  approx 87% and will need Home O2. RNCM called [facility] and spoke with Westley HummerLisa Tucker, LPN who assures me there is a contract agency to provide any needed DME, and if needed pt can gp to [facility] or "[facility]illed [facility]" where he can be more closely monitored for the weekend. She will contact the supervisor on call and call me back ASAP. CM will update MD as soon as able. Misty StanleyLisa also wanted MD to know that this gentleman has a wound on his leg that they feared was becoming "septic" as he appeared at the Nurses station 2300 prior to am fall confused and this is NOT HIS NORM. He is oriented, alert at baseline.                   Action/Plan:CM will follow closely for disposition/discharge needs.    Expected Discharge Date:                  Expected Discharge Plan:  Assisted Living [facility]  In-House Referral:  Clinical Social Work  Discharge planning Services  CM Consult  Post Acute Care Choice:  Durable Medical Equipment Choice offered to:  NA Westley Hummer(Lisa Tucker, LPN [facility])  DME Arranged:  Oxygen DME Agency:     HH Arranged:    HH Agency:     Status of Service:  In process, will continue to follow  If discussed at Long Length of Stay Meetings, dates discussed:    Additional Comments:  Yvone NeuCrutchfield, Elynor Kallenberger M, RN 06/25/2016, 9:59 AM

## 2016-06-25 NOTE — ED Notes (Signed)
Pt had UNA boot to [facility], removed to examine. Rewrapped with non-adherent and cobain.

## 2016-06-25 NOTE — ED Notes (Signed)
Pt's oxygen dropped to 86% RA when sleeping. 2 L Hendricks applied. Dr. Shela CommonsJ made aware, will order blood gas.

## 2016-06-25 NOTE — Progress Notes (Signed)
Spoke with Alesia BandaLeanne the Supervisor at [facility] who tells me Mr Leon Evans can return to his ALF bed and Oxygen will be provided as directed on discharge instructions. No need for hard Rx etc.. If his condition changes [facility] will increase his level of care at their discretion. If Mr Leon Evans needs O2 in transport he can return to [facility] via [facility].  No additional needs from this CM at this time. Updated MD. Will sign off for now but will be available if needed.

## 2017-01-07 DEATH — deceased

## 2018-03-19 IMAGING — CT CT CERVICAL SPINE W/O CM
4 of 8 series · 13 of 33 positions shown, 14 images · non-contrast
Comparison: 03/04/2011

CLINICAL DATA: Fall, abrasions forehead

EXAM:
CT HEAD WITHOUT CONTRAST
CT CERVICAL SPINE WITHOUT CONTRAST
TECHNIQUE: Multidetector CT imaging of the head and cervical spine was
performed following the standard protocol without intravenous
contrast. Multiplanar CT image reconstructions of the cervical spine
were also generated.

[Series 4: head bone · axial · 0.48mm/px · z∈[-24,+32]mm · 2 of 86 slices shown]
[im 29/86  bone]
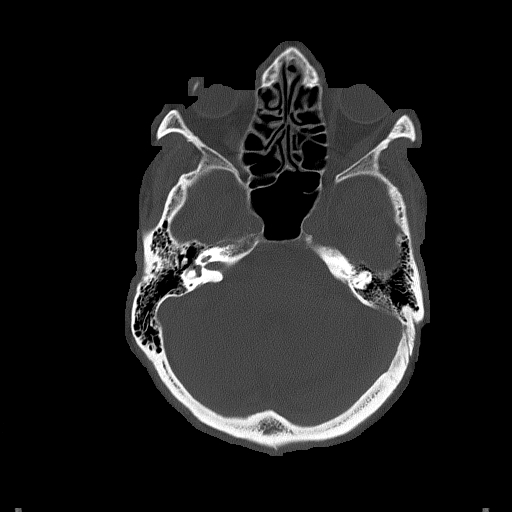
[im 57/86  bone]
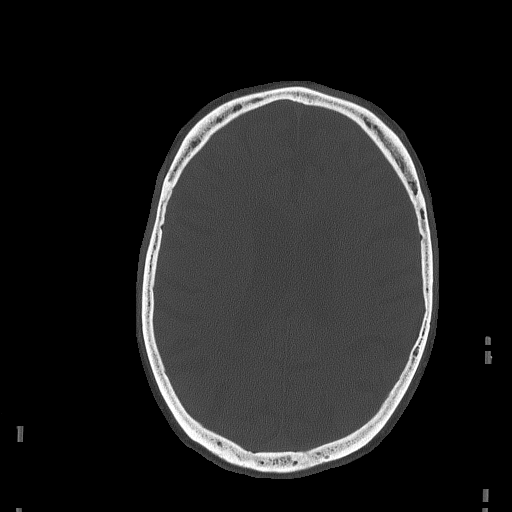

[Series 8: head without cor · coronal · non-contrast · 0.29mm/px · 3 of 65 slices shown]
[im 17/65  bone]
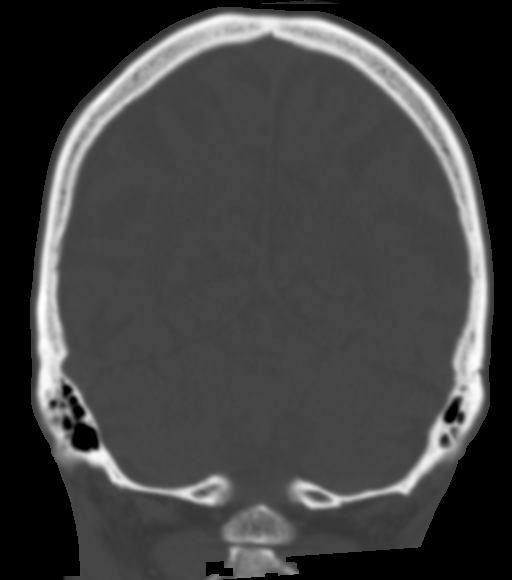
[im 33/65  bone]
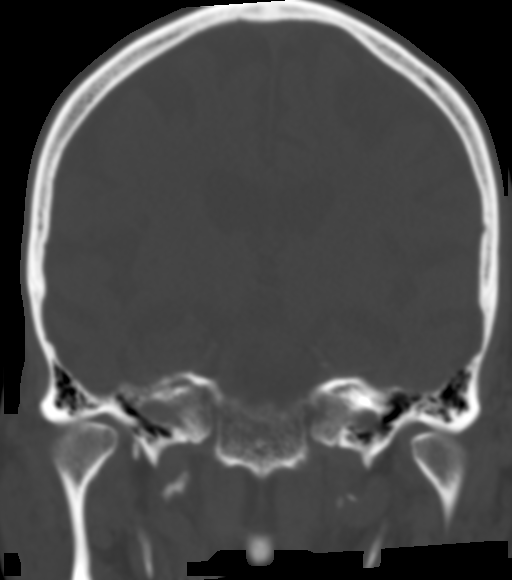
[im 49/65  bone]
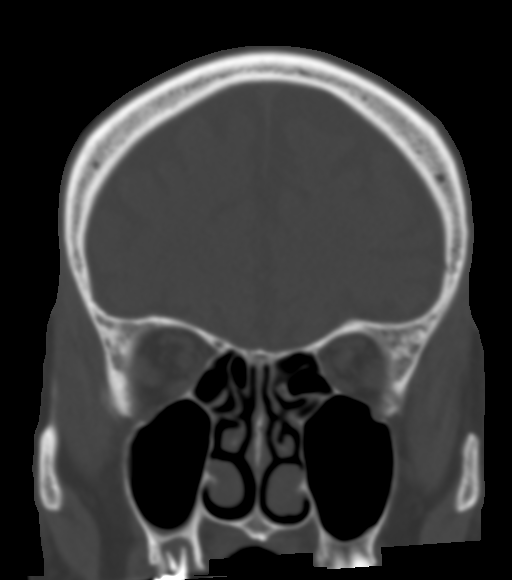

[Series 10: c_spine 2.0 st · axial · 0.52mm/px · z∈[-243,-91]mm · 4 of 102 slices shown, 5 images]
[im 21/102  soft-tissue]
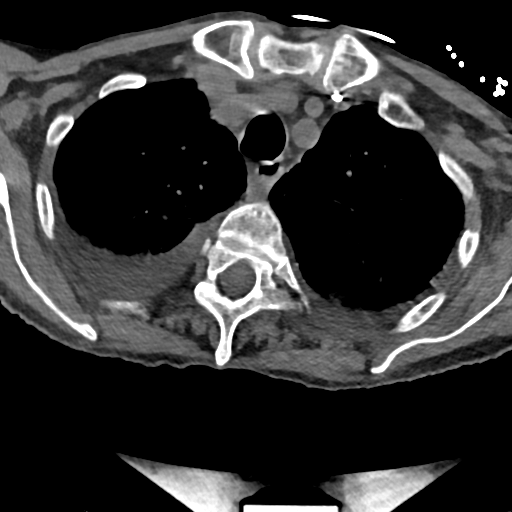
[im 21/102  bone]
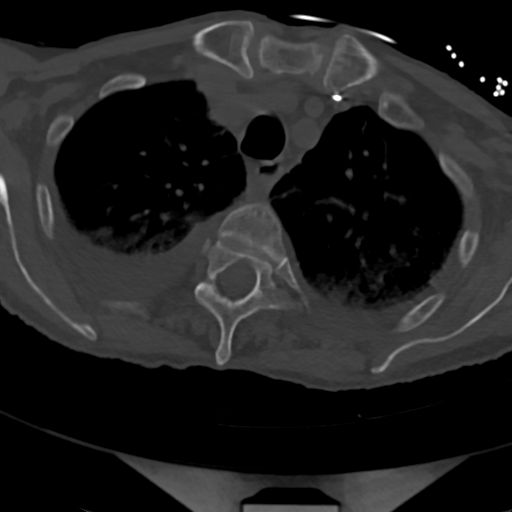
[im 41/102  bone]
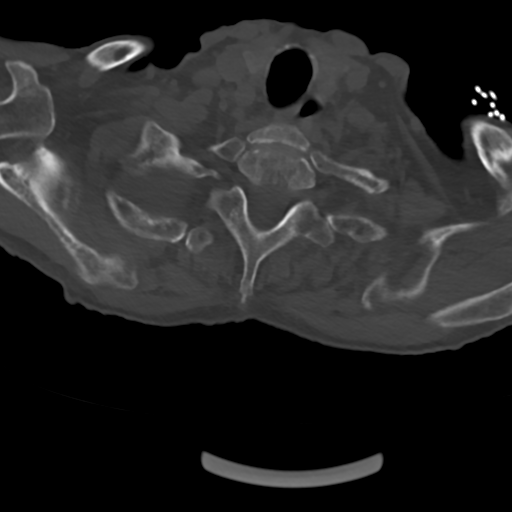
[im 61/102  bone]
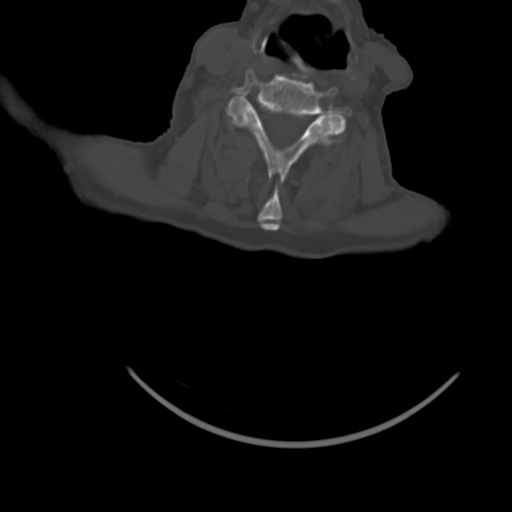
[im 81/102  bone]
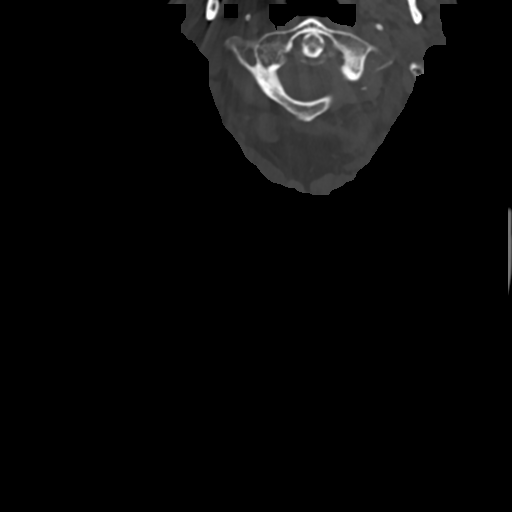

[Series 11: c_spine 2.0 sag bone · sagittal · 0.34mm/px · 4 of 49 slices shown]
[im 10/49  bone]
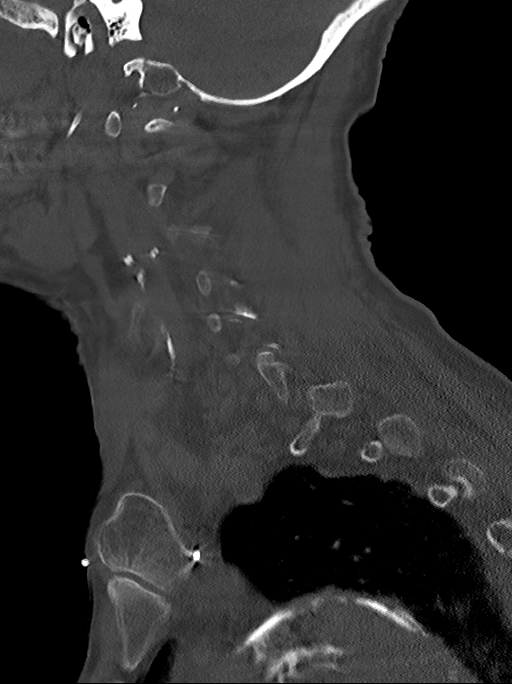
[im 20/49  bone]
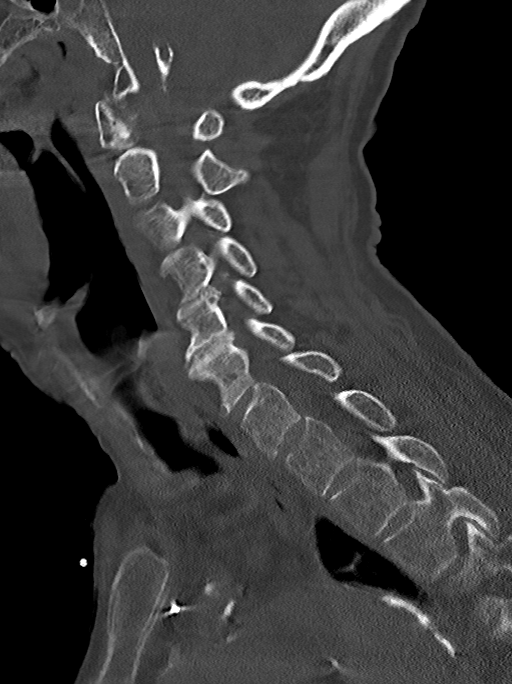
[im 29/49  bone]
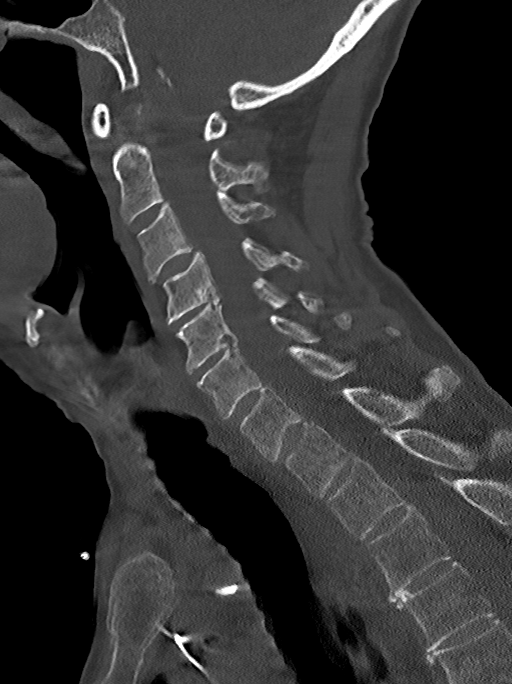
[im 39/49  bone]
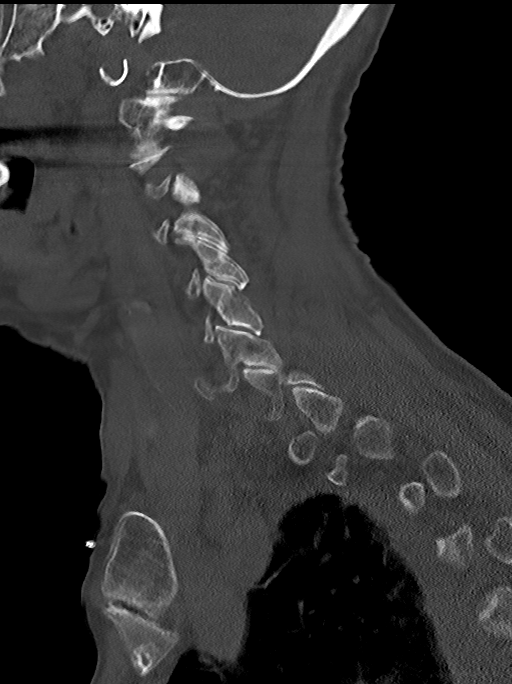

[13 of 33 positions shown; findings below may reference images not displayed]

FINDINGS: CT HEAD FINDINGS

Brain: There is atrophy and chronic small vessel disease changes. No
acute intracranial abnormality. Specifically, no hemorrhage,
hydrocephalus, mass lesion, acute infarction, or significant
intracranial injury.

Vascular: No hyperdense vessel or unexpected calcification.

Skull: No acute calvarial abnormality.

Sinuses/Orbits: Mucosal thickening in the ethmoid air cells and
right maxillary sinus. No air-fluid levels. Mastoids are clear.

Other: None

CT CERVICAL SPINE FINDINGS

Alignment: Normal

Skull base and vertebrae: No fracture.

Soft tissues and spinal canal: No epidural or paraspinal hematoma.
Prevertebral soft tissues are normal.

Disc levels:  Diffuse degenerative disc and facet disease.

Upper chest: Layering bilateral pleural effusions and ground-glass
opacities in the lungs, possibly edema.

Other: None
IMPRESSION: No acute intracranial abnormality. Atrophy, chronic small vessel
disease.

Degenerative changes in the cervical spine. No acute bony
abnormality.

Bilateral pleural effusions and ground-glass airspace opacities in
the upper lobes, likely edema.
